# Patient Record
Sex: Male | Born: 2005 | Race: White | Hispanic: No | Marital: Single | Smoking: Never smoker
Health system: Southern US, Community
[De-identification: ages and names within clinical notes are randomized; demographics above are authoritative.]

## PROBLEM LIST (undated history)

## (undated) ENCOUNTER — Emergency Department (HOSPITAL_BASED_OUTPATIENT_CLINIC_OR_DEPARTMENT_OTHER): Admission: EM | Payer: Medicaid Other | Source: Home / Self Care

## (undated) DIAGNOSIS — R519 Headache, unspecified: Secondary | ICD-10-CM

## (undated) DIAGNOSIS — R51 Headache: Secondary | ICD-10-CM

## (undated) DIAGNOSIS — J45990 Exercise induced bronchospasm: Secondary | ICD-10-CM

## (undated) DIAGNOSIS — J3089 Other allergic rhinitis: Secondary | ICD-10-CM

## (undated) HISTORY — PX: EYE SURGERY: SHX253

## (undated) HISTORY — DX: Headache, unspecified: R51.9

## (undated) HISTORY — DX: Headache: R51

## (undated) HISTORY — PX: CIRCUMCISION: SUR203

---
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
## 2023-02-03 NOTE — Progress Notes (Signed)
 Subjective   Patient ID:  Martin Reyes is a 18 y.o. (DOB 06/01/2005) male.   Chief Complaint  Patient presents with  . URI    Complains of headache, body aches, chest tightness, nausea, cough, cong, sore throat, chills/sweats, fatigue since last night but has gotten worse today     18 year old male here today presenting with chills and cough that started yesterday.  States Martin Reyes woke up this morning and felt a lot worse, including increased fatigue, with generalized chest aching that is constant, worse with coughing, denies any improving factors.  Also reports associated nausea, decreased appetite.  Denies any fever, shortness of breath, abdominal pain, diarrhea, vomiting, numbness, tingling, weakness, ear pain, sore throat.  Only OTC meds that Martin Reyes is used has been Benadryl  last night, denies any Tylenol  or ibuprofen  use.  Denies any pertinent past medical history.  Reports occasional vaping and marijuana use, denies cigarette use.   URI    Review of Systems As stated in HPI   Objective   BP 124/84 (BP Location: Left Upper Arm, Patient Position: Sitting)   Pulse 105   Temp 99.8 F (37.7 C) (Tympanic)   Resp 19   Ht 6' 3 (1.905 m)   Wt 193 lb (87.5 kg)   SpO2 95%   BMI 24.12 kg/m   Physical Exam Vitals and nursing note reviewed.  Constitutional:      Appearance: Normal appearance.  HENT:     Head: Normocephalic and atraumatic.     Right Ear: Tympanic membrane, ear canal and external ear normal. There is no impacted cerumen.     Left Ear: Tympanic membrane, ear canal and external ear normal. There is no impacted cerumen.     Nose: Congestion and rhinorrhea present.     Mouth/Throat:     Mouth: Mucous membranes are moist.     Pharynx: Posterior oropharyngeal erythema present. No oropharyngeal exudate.  Eyes:     Extraocular Movements: Extraocular movements intact.     Conjunctiva/sclera: Conjunctivae normal.     Pupils: Pupils are equal, round, and reactive to light.   Cardiovascular:     Rate and Rhythm: Normal rate and regular rhythm.     Heart sounds: Normal heart sounds.  Musculoskeletal:     Cervical back: Neck supple. No tenderness.  Pulmonary:     Effort: Pulmonary effort is normal. No respiratory distress.     Breath sounds: No stridor. Rales (Mild, faint, fine rales throughout all posterior lung fields) present. No wheezing or rhonchi.  Abdominal:     General: Abdomen is flat.     Palpations: Abdomen is soft.     Tenderness: There is abdominal tenderness. There is no guarding or rebound. Negative signs include Rovsing's sign, McBurney's sign, psoas sign and obturator sign.     Comments: Mild point tenderness to palpation of right lower quadrant.  Lymphadenopathy:     Cervical: No cervical adenopathy.  Skin:    General: Skin is warm and dry.     Findings: No rash.  Neurological:     General: No focal deficit present.     Mental Status: Martin Reyes is alert and oriented to person, place, and time. Mental status is at baseline.  Psychiatric:        Mood and Affect: Mood normal.        Behavior: Behavior normal.        Judgment: Judgment normal.       Medical Decision Making   Problems Addressed:  1. Influenza  A  POCT Flu Nucleic Acid   oseltamivir phosphate (TAMIFLU) 75 mg capsule    2. Chills  POCT Flu Nucleic Acid   POCT CoVid-19 nucleic acid       MDM   Amount and/or Complexity of Data Reviewed Clinical lab tests: reviewed Discussion of test results with the performing providers: no Decide to obtain previous medical records or to obtain history from someone other than the patient: no Obtain history from someone other than the patient: no Review and summarize past medical records: yes Discuss the patient with other providers: no Independent visualization of images, tracings, or specimens: no     Encounter orders Orders Placed This Encounter  Procedures  . POCT Flu Nucleic Acid  . POCT CoVid-19 nucleic acid     Lab  results if available: Recent Results (from the past 12 hour(s))  POCT Flu Nucleic Acid   Collection Time: 02/03/23  3:13 PM  Result Value Ref Range   Influenza A/B A Positive (A) Negative  POCT CoVid-19 nucleic acid   Collection Time: 02/03/23  3:13 PM  Result Value Ref Range   SARS-COV-2, NAA Negative Negative     Assessment and Plan:  Martin Reyes was seen today for uri.  Diagnoses and all orders for this visit:  Influenza A -     POCT Flu Nucleic Acid -     oseltamivir phosphate (TAMIFLU) 75 mg capsule; Take one capsule (75 mg dose) by mouth 2 (two) times daily for 5 days.  Chills -     POCT Flu Nucleic Acid -     POCT CoVid-19 nucleic acid    Flu was positive in clinic today.  COVID test was negative. Will prescribe Tamiflu due to symptom onset within 48-hours.  Low risk for pneumonia, pneumothorax, acute coronary syndrome, pulmonary embolism due to symptom duration of 24 hours, mild Rales clearing with cough, and otherwise reassuring physical exam. Will recommend Mucinex and Tylenol  over-the-counter for further symptom management.  Recommending rest and increased fluid intake.  Strict return precautions were provided. The patient and I engaged in shared decision making regarding their treatment. Risks, benefits, and alternatives of the medications (OTC and or prescribed) and treatment plan prescribed today were discussed. They expressed understanding and their questions were answered. It is extremely important, and Martin Reyes understands the importance, if symptoms do not improve or worsen in the next 4 days or if new symptoms arise, Martin Reyes will follow up with their PCP, our clinic, or go to the ED if worse. Martin Reyes is advised to follow up with their PCP for health maintenance and chronic illness management.  The following instructions were provided and discussed with the patient (or parent/guardian) as part of the care plan. Patient Instructions  Martin Reyes tested positive for the flu in clinic  today.  Your COVID test was negative.  We will prescribe Tamiflu, this will not make you feel better, however it should shorten your duration and intensity of symptoms. Take as prescribed.  In addition to Tamiflu, I would like for you to take the following over-the-counter medications:  Mucinex: This is a mucolytic that will help to thin your secretions so that you can cough them up.  Take as directed on the box.  Make sure to drink extra fluids while taking this.    Tylenol : Take 650 mg (2 of the 325 mg tablets) every 6 hours to treat pain.  This can be alternated with ibuprofen  to provide better coverage.  Increase your fluid intake.  I like  to mix half a bottle of Gatorade with ice water and a pinch of salt for a tasty and hydrating drink without all the sugar.  Make sure you get plenty of rest, follow up with your PCP this week if symptoms do not improve.  If symptoms worsen, return here or go to the emergency department for evaluation.       Some text above is via Dragon naturally speaking voice recognition transcription - there may be typographical errors.

## 2023-05-25 NOTE — Progress Notes (Signed)
 [facility] PRE-[employer]MENT PHYSICAL EXAMINATION FORM  Patient's Name Martin Reyes                                       Soc. Sec. #            BP (!) 126/50 (BP Location: Right arm, Patient Position: Sitting)   Pulse 106   Temp 98 F (36.7 C) (Temporal)   Ht 1.905 m (6' 3)   Wt 84.4 kg (186 lb)   SpO2 97%   BMI 23.25 kg/m  Patient Wears N/A Distant Vision Vision Screening   Right eye Left eye Both eyes  Without correction 20/20 20/25 20/20   With correction       Near Vision    Color Vision Biggers Results:  (Color Vision: Red, Yellow,Blue, Green WNL)          Hearing Screen Whisper test results                                                                  TO BE FILED OUT BY PHYSICIAN  NORMAL ABNORMAL             DESCRIBE ABNORMALITIES BELOW  General Appearance    X    Head and Neck    X    Scalp and Face    X    Eyes    X    Pupil, Ocular Motility    X    Nose and Sinuses    X    Mouth and Throat    X    Teeth    X    Ears, Tympanic Membrane    X    Thyroid    X    Lungs    X    Heart    X    Peripheral Vascular System    X    Abdomen    X    Genitalia    X deferred, no complaints    Hernia    X    Spine (R.O.M., pain, abnormal curves)    X    Upper Extremities    X    Lower Extremities    X    Skin    X    Lymphatic System    X    Neuromuscular System    X    Other    X        [x]   Full duty without restrictions    []   Recommended with the following restrictions                                                                     []   Not recommended at this time  Rushdan Deatrice Majors, DNP Electronically signed by Genna Deatrice Majors, DNP 05/25/23  Provider's Printed Name Provider's Signature Date          UC-220 (1/17)   HEALTH  HISTORY Instructions to Applicant. Complete this form prior to your physical examination and give it to the examining physician at the time of  examination. Answer all questions completely and accurately. This information is confidential.   Martin Reyes Other Title Soc. Sec. # [id number] (last, first, middle) Address [address] [place] [address]  Phone # [phone] Date of Birth May 08, 2005 Age 18 y.o.  Sex male   PLEASE ANSWER EACH QUESTION. IF ANSWER IS YES, GIVE EXPLANATION BY NUMBER IN THE INDICATED AT THE BOTTOM OF THE PAGE                                                                      DO YOU HAVE OR HAVE YOU HAD: YES NO []      [x]    1 Have you ever had an operation? []      [x]  2 Any previous surgery? []      [x]  3 Been hospitalized for any reason? []      [x]  4 Heart disease? []      [x]  5 Irregular or rapid heartbeat? []      [x]  6 Pains in chest or abnormal electrocardiogram? []      [x]  7 High blood pressure? []      [x]  8 Stroke? []      [x]  9 Shortness or breath after mild exercise? []      [x]  10 Disease of lung tuberculosis, emphysema, asthma, pneumonia, chronic lung condition? []      [x]  11 Persistent cough or coughed up blood? []      [x]  12 Dizzy spells or fainting? []      [x]  13 Frequent or severe headaches? []      [x]  14 Leg cramps, varicose veins or swelling of ankles or feet? []      [x]  15 Anemia or other blood condition? []      [x]  16 Kidney or bladder trouble? []      [x]  17 Prostate trouble? []      [x]  18 Stomach trouble (indigestion, ulcers)? []      [x]  19 Gall bladder trouble (gallstones)? []      [x]  20 Liver disease or jaundice? []      [x]  21 Rectal trouble? []      [x]  22 Rupture or hernia? []      [x]  23 Frequent or severe sore throats or colds? []      [x]  24 Severe ear trouble or difficulty in hearing? []      [x]  25 Difficulty with your vision (blurred, glaucoma, cataract)? []      [x]  26 Broken bones or bone disease? []      [x]  27 Disease, swelling limitation or pain in joints? []      [x]  28 Muscle weakness or paralysis? []      [x]  29 Neck pains or whiplash injury? []       [x]  30 Back injury? []      [x]  31 Arthritis, gout or rheumatism?  YES NO []      [x]  32 Skin rash from soaps or chemicals? []      [x]  33 Allergies (plants, foods, dusts, chemicals, pollen,  medicines)? []      [x]  34 Growth, tumor or cancer? []      [x]  35 Venereal disease (syphilis, gonorrhea)? []      [x]  36 Epilepsy, seizures, fit or convulsions? []      [x]  37 Been treated for mental illness? []      [x]  38 Been treated for drug abuse? []      [x]  39 Excessive worry, depression, nervousness? []      [x]  40 Recent weight gain or loss of more than 10 pounds? []      [x]  41 Head injury or knocked unconscious? []      [x]  42 Had any severe illnesses, injuries or operation not mentioned above? []      [x]  43 Rejected or terminated from work for medical reasons? []      [x]  44 Workers compensation disability? []      [x]  45 Treatment by physician within the last 3 years, excepting minor illnesses or injuries? []      [x]  46 Are you a regular user of sleeping pills, pain killers, marijuana or tranquilizers? []      [x]  47 Are you a regular user of sleeping pills, pain killers, marijuana or tranquilizers? If "yes", when:    [x]      []  48 Are you presently taking medications? []      [x]  49 Do you drink alcohol? How often?    []      [x]  50 Do you smoke? Number of packs/day:     FEMALES ONLY: []      []  51 Do you have painful or irregular menstruation? []      []  52 Been treated for a male disorder?  NUMBER EXPLANATION  48 Escitalopram (GAD) , controlled; Fluticasone propionate, allergies, controlled                                      CERTIFICATION  I REVIEWED WITH THE PATIENT WHO CERTIFIED THAT THERE ARE NO WILLFUL MISREPRESENTATIONS, OMISSIONS OR FALSIFICATIONS IN THE FOREGOING STATEMENTS AND ANSWERS TO QUESTIONS, AND THAT ALL STATEMENTS AND ANSWERS ARE TRUE AND CORRECT TO THE BEST OF MY KNOWLEDGE AND BELIEF.  Signature of Reviewing Provider Electonically signed by Genna Deatrice Majors,  DNP    Date Signed 05/25/23   History of Present Illness The patient is a 18 year old male who presents today for a preemployment physical. He has no known allergies to medication. Seasonal allergies are controlled with fluticasone propionate 50 mcg nasal spray. He also has an anxiety disorder, with symptoms managed by escitalopram 10 mg.  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 He has expressed interest in marine biology or environmental science and is considering further studies in ecology. 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 He is currently in his second year at [employer].  He has a history of irritable bowel syndrome (IBS), which he believes was exacerbated by anxiety. However, he reports no significant issues with IBS over the past 2 years, noting that it was more problematic during his high school years.  PAST SURGICAL HISTORY: He sustained a fracture to his left foot at the age of 3 or 4, which required casting. He reports no other significant hospitalizations.  SOCIAL HISTORY - Currently in his second year at [employer]

## 2023-07-18 ENCOUNTER — Other Ambulatory Visit: Payer: Self-pay

## 2023-07-18 ENCOUNTER — Emergency Department (HOSPITAL_BASED_OUTPATIENT_CLINIC_OR_DEPARTMENT_OTHER)
Admission: EM | Admit: 2023-07-18 | Discharge: 2023-07-18 | Disposition: A | Discharge status: Home / Self Care | Attending: Emergency Medicine | Admitting: Emergency Medicine

## 2023-07-18 ENCOUNTER — Encounter (HOSPITAL_BASED_OUTPATIENT_CLINIC_OR_DEPARTMENT_OTHER): Payer: Self-pay | Admitting: Emergency Medicine

## 2023-07-18 DIAGNOSIS — L089 Local infection of the skin and subcutaneous tissue, unspecified: Secondary | ICD-10-CM | POA: Diagnosis present

## 2023-07-18 MED ORDER — CEPHALEXIN 500 MG PO CAPS: 500.0000 mg | ORAL_CAPSULE | Freq: Four times a day (QID) | ORAL | 0 refills | Status: AC

## 2023-07-18 MED ORDER — CEPHALEXIN 250 MG PO CAPS
500.0000 mg | ORAL_CAPSULE | Freq: Once | ORAL | Status: AC
Start: 2023-07-18 — End: 2023-07-18
  Administered 2023-07-18: 500 mg via ORAL
  Filled 2023-07-18: qty 2

## 2023-07-18 MED ORDER — CEPHALEXIN 500 MG PO CAPS
500.0000 mg | ORAL_CAPSULE | Freq: Four times a day (QID) | ORAL | 0 refills | Status: DC
  Filled 2023-07-18: qty 28, 7d supply, fill #0

## 2023-07-18 NOTE — ED Triage Notes (Signed)
 Pt with multiple blister-like areas on bil feet since Tues; sts some are draining yellow material

## 2023-07-18 NOTE — Discharge Instructions (Addendum)
 Thank you for let us  evaluate you today.  It appears that you may have some infected and nodules from insect bite, skin irritation, trauma to skin that have worsened with itching.  I have sent a prescription for Keflex  for skin infection.  Please follow-up with primary care doctor, urgent care, ED in 2-3 days for wound recheck. You may continue using hydrocortisone cream for itching, cooling. Keep area cool and dry.

## 2023-07-18 NOTE — ED Provider Notes (Signed)
 [place] [facility] Provider Note   CSN: [id number] Arrival date & time: 07/18/23  8086     Patient presents with: Wound Check   Martin Reyes is a 18 y.o. male with past medical history of migraine, insomnia, concussion, exercise-induced asthma presents emergency department for evaluation of blisters patient noticed on top of feet initially 3 weeks ago that resolved and came back 5 days ago.  Blisters are pruritic and burning.  Notes he works outside in [employer] but is always wearing shoes.  He has tried hydrocortisone cream, triamcinolone, benadryl  for symptoms without relief. Denies seeing insect, spider, chigger, tick, fevers   {Add pertinent medical, surgical, social history, OB history to HPI:32947}  Wound Check       Prior to Admission medications   Medication Sig Start Date End Date Taking? Authorizing Provider  cetirizine (ZYRTEC) 10 MG tablet Take 10 mg by mouth daily.    [provider]  hydrOXYzine  (ATARAX /VISTARIL ) 25 MG tablet Take 1 tablet (25 mg total) by mouth every 6 (six) hours. 06/09/20   Geroldine Berg, MD  metoCLOPramide  (REGLAN ) 10 MG tablet Take 0.5-1 tablets (5-10 mg total) by mouth 3 (three) times daily as needed for nausea or vomiting. 09/03/20   Harris, Abigail, PA-C  predniSONE  (DELTASONE ) 10 MG tablet Take 2 tablets (20 mg total) by mouth 2 (two) times daily. 06/09/20   Geroldine Berg, MD    Allergies: Patient has no known allergies.    Review of Systems  Skin:  Positive for wound.    Updated Vital Signs BP 135/78 (BP Location: Right Arm)   Pulse 83   Temp 99.5 F (37.5 C)   Resp 14   Ht 6' 3 (1.905 m)   Wt 83.5 kg   SpO2 97%   BMI 23.00 kg/m   Physical Exam Vitals and nursing note reviewed.  Constitutional:      General: He is not in acute distress.    Appearance: Normal appearance.  HENT:     Head: Normocephalic and atraumatic.  Eyes:     Conjunctiva/sclera: Conjunctivae normal.   Cardiovascular:     Rate and Rhythm: Normal rate.  Pulmonary:     Effort: Pulmonary effort is normal. No respiratory distress.  Musculoskeletal:     Right lower leg: No edema.     Left lower leg: No edema.  Skin:    Coloration: Skin is not jaundiced or pale.  Neurological:     Mental Status: He is alert. Mental status is at baseline.     (all labs ordered are listed, but only abnormal results are displayed) Labs Reviewed - No data to display  EKG: None  Radiology: No results found.   Medications Ordered in the ED - No data to display    {Click here for ABCD2, HEART and other calculators REFRESH Note before signing:1}                              Medical Decision Making Risk Prescription drug management.   Patient presents to the ED for concern of wound, this involves an extensive number of treatment options, and is a complaint that carries with it a high risk of complications and morbidity.  The differential diagnosis includes cellulitis, tick bite, spider bite, chigger bite, lyme disease, RMSF   Co morbidities that complicate the patient evaluation  None   Additional history obtained:  Additional history obtained from Nursing  External records from outside source obtained and reviewed including triage RN note     Medicines ordered and prescription drug management:  I ordered medication including keflex   for skin infection  Reevaluation of the patient after these medicines showed that the patient improved I have reviewed the patients home medicines and have made adjustments as needed    Problem List / ED Course:  Skin infection Likely 2/2 excoriation from itching. There is mild purulent discharge   Reevaluation:  After the interventions noted above, I reevaluated the patient and found that they have :{resolved/improved/worsened:23923::improved}   Social Determinants of Health:  ***   Dispostion:  After consideration of the diagnostic  results and the patients response to treatment, I feel that the patent would benefit from ***.    {Document critical care time when appropriate  Document review of labs and clinical decision tools ie CHADS2VASC2, etc  Document your independent review of radiology images and any outside records  Document your discussion with family members, caretakers and with consultants  Document social determinants of health affecting pt's care  Document your decision making why or why not admission, treatments were needed:32947:::1}   Final diagnoses:  None    ED Discharge Orders     None

## 2023-07-20 ENCOUNTER — Other Ambulatory Visit (HOSPITAL_BASED_OUTPATIENT_CLINIC_OR_DEPARTMENT_OTHER): Payer: Self-pay
